# Patient Record
Sex: Female | Born: 1985 | Race: White | Hispanic: No | Marital: Single | Smoking: Former smoker
Health system: Southern US, Community
[De-identification: ages and names within clinical notes are randomized; demographics above are authoritative.]

---
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
## 2007-11-08 ENCOUNTER — Emergency Department: Payer: Self-pay | Admitting: Emergency Medicine

## 2008-07-13 ENCOUNTER — Emergency Department: Payer: Self-pay | Admitting: Unknown Physician Specialty

## 2009-05-28 ENCOUNTER — Emergency Department: Payer: Self-pay | Admitting: Emergency Medicine

## 2010-01-15 ENCOUNTER — Emergency Department: Payer: Self-pay | Admitting: Emergency Medicine

## 2011-12-15 IMAGING — CR DG KNEE COMPLETE 4+V*R*
1 series · 4 of 4 positions shown · non-contrast
Comparison: none

REASON FOR EXAM: R knee pain, swelling
COMMENTS:   LMP: Four weeks ago

PROCEDURE:     DXR - DXR KNEE RT COMP WITH OBLIQUES  - May 28, 2009  [DATE]
RESULT:     Four views of the right knee reveal the bones to be adequately
mineralized. I do not see evidence of an acute fracture nor dislocation.
There is no definite joint effusion.

[Series 1: view not recorded · 0.17mm/px · 4 of 4 slices shown]
[im 1/4]
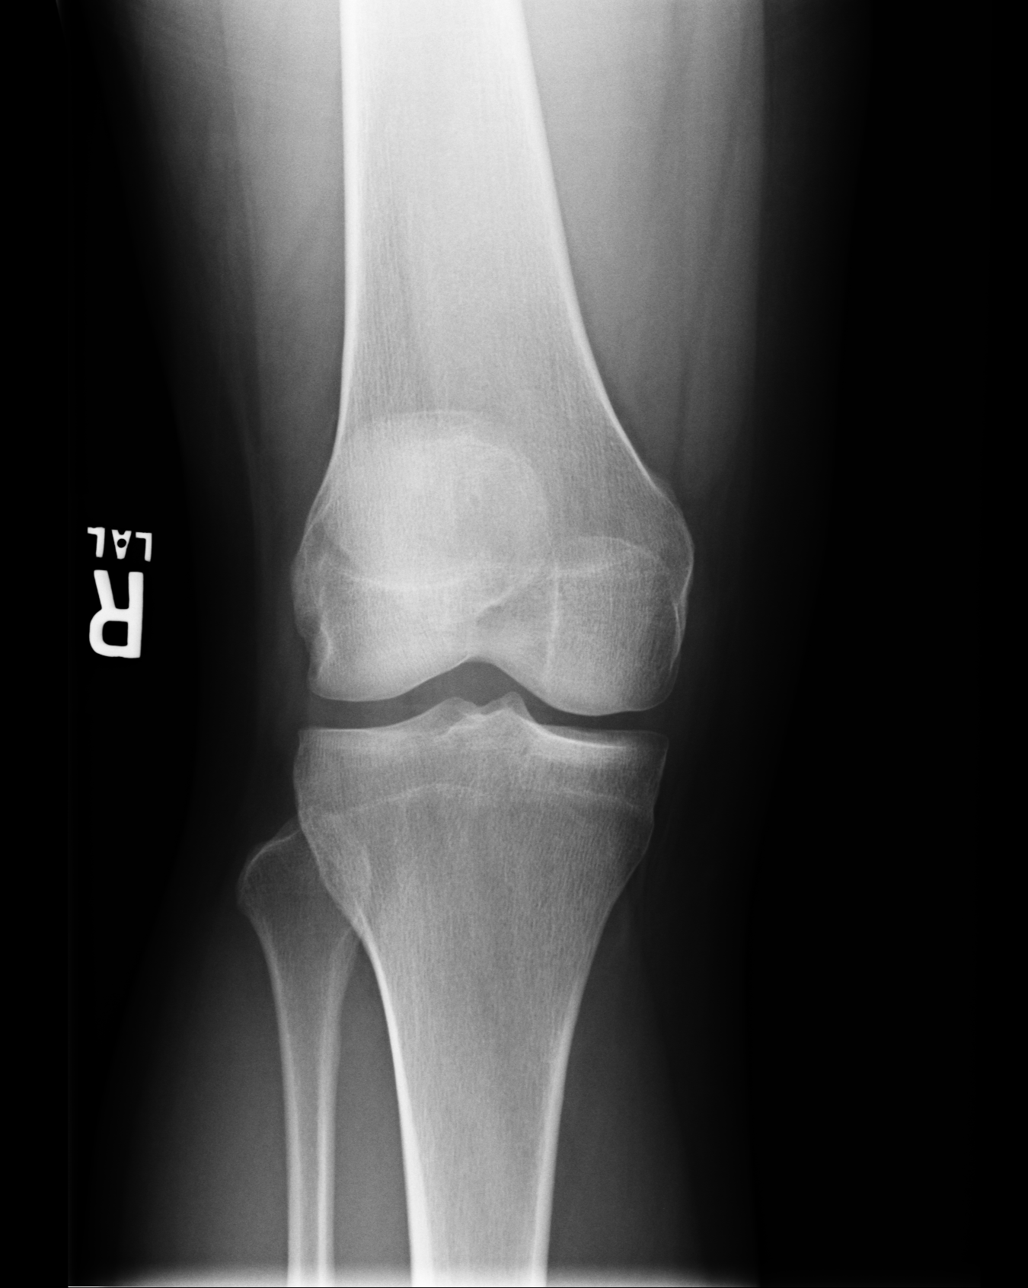
[im 2/4]
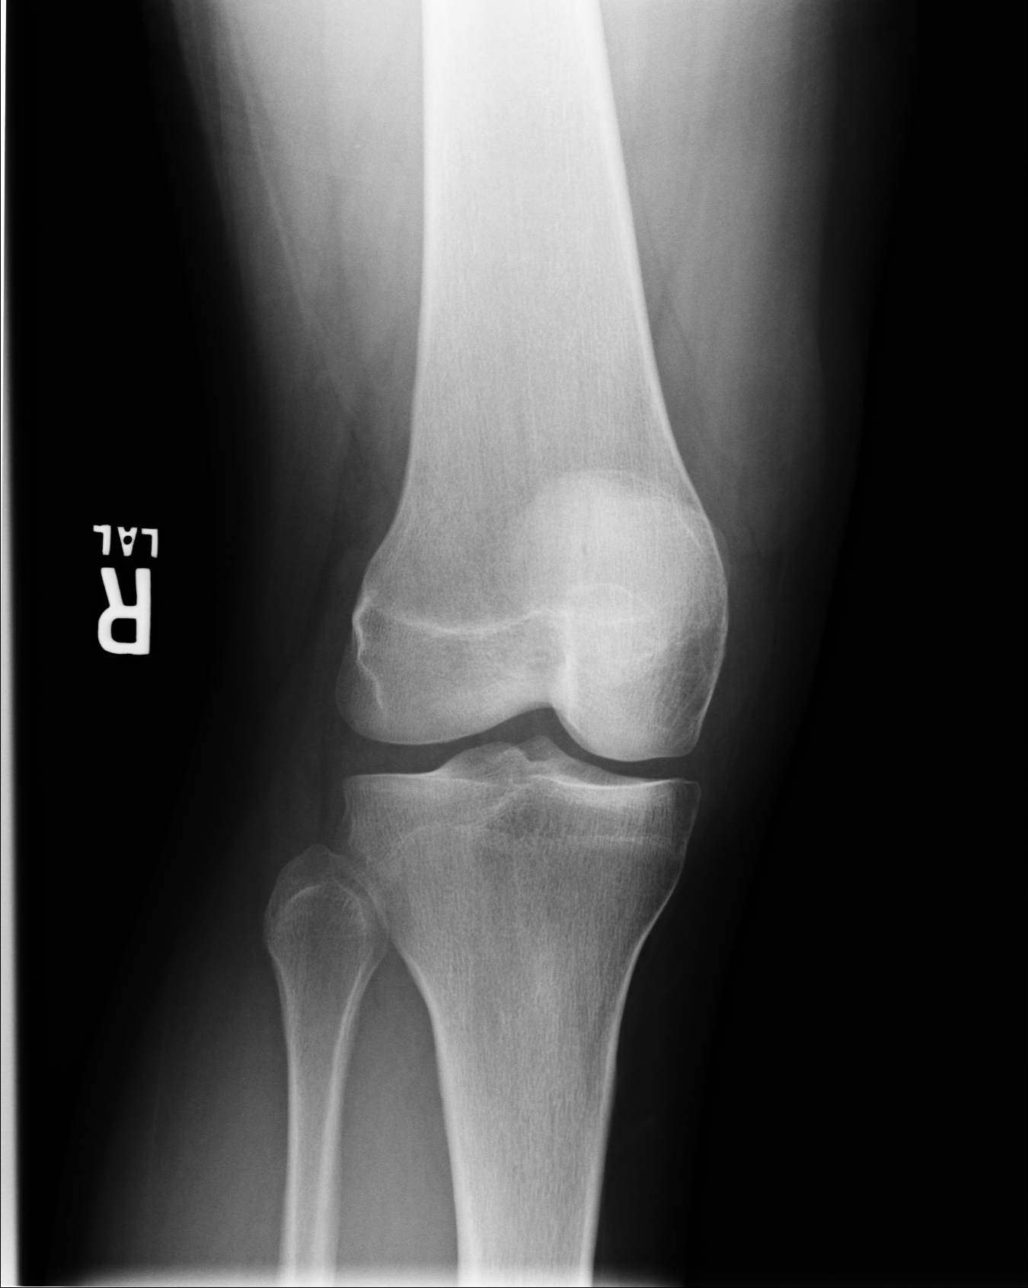
[im 3/4]
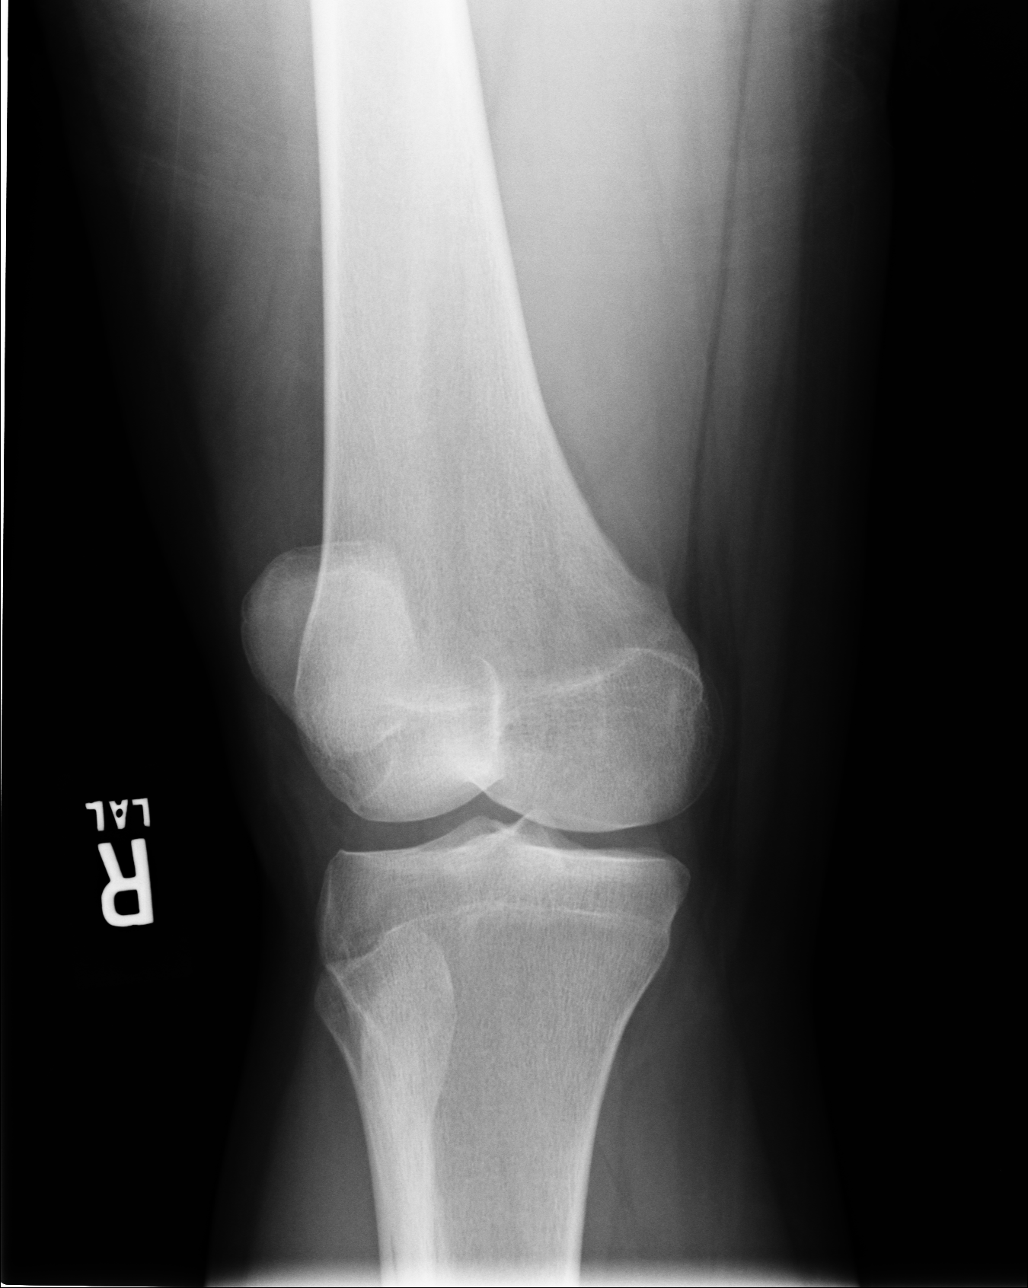
[im 4/4]
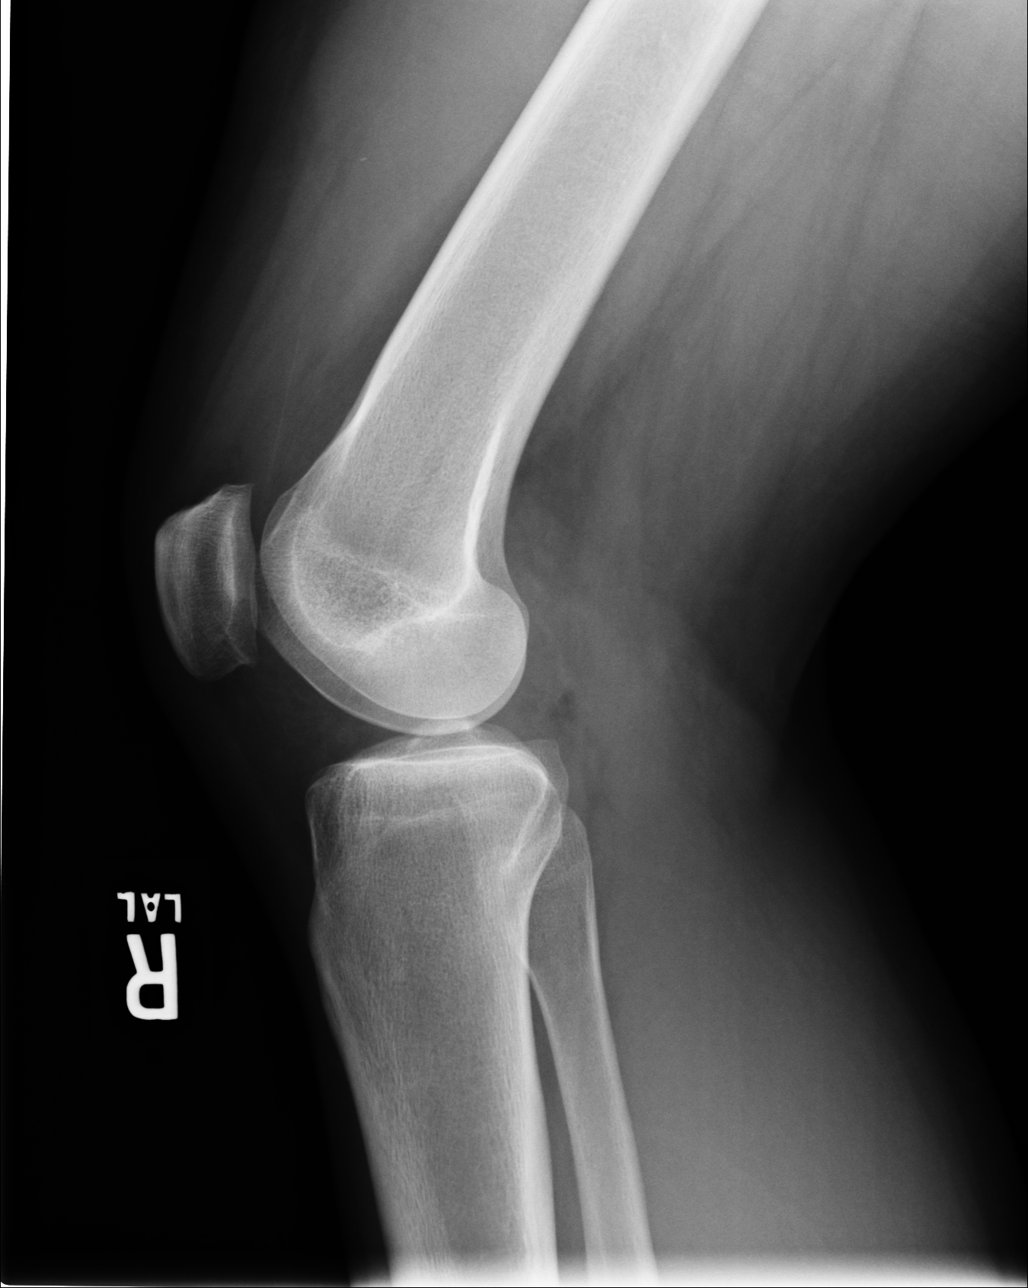

[4 of 4 positions shown; findings below may reference images not displayed]

IMPRESSION: I see no acute bony abnormality of the right foot. Followup
MRI may be useful if there are clinical concerns of internal derangement.

## 2012-04-29 ENCOUNTER — Emergency Department: Payer: Self-pay | Admitting: Emergency Medicine

## 2016-02-12 LAB — LAB REPORT - SCANNED: EGFR: 60

## 2018-08-27 ENCOUNTER — Ambulatory Visit (HOSPITAL_COMMUNITY)
Admission: EM | Admit: 2018-08-27 | Discharge: 2018-08-27 | Discharge status: Absent without leave | Payer: Self-pay | Attending: Family Medicine | Admitting: Family Medicine

## 2022-05-13 ENCOUNTER — Encounter: Payer: Self-pay | Admitting: Family Medicine

## 2022-05-13 ENCOUNTER — Ambulatory Visit (INDEPENDENT_AMBULATORY_CARE_PROVIDER_SITE_OTHER): Payer: 59 | Admitting: Family Medicine

## 2022-05-13 VITALS — BP 142/68 | HR 98 | Temp 98.6°F | Ht 67.0 in | Wt 201.0 lb

## 2022-05-13 DIAGNOSIS — Z7689 Persons encountering health services in other specified circumstances: Secondary | ICD-10-CM | POA: Insufficient documentation

## 2022-05-13 DIAGNOSIS — R030 Elevated blood-pressure reading, without diagnosis of hypertension: Secondary | ICD-10-CM | POA: Diagnosis not present

## 2022-05-13 DIAGNOSIS — H9201 Otalgia, right ear: Secondary | ICD-10-CM

## 2022-05-13 NOTE — Assessment & Plan Note (Signed)
No history of elevated BP. 142/68 today in office and she does endorse feeling anxious about her visit today. Encouraged to check BP outside of office. Will return in 1 week for physical and recheck. Denies chest pain, palpitations, shortness of breath, recurrent headaches, vision changes.

## 2022-05-13 NOTE — Assessment & Plan Note (Signed)
Today we reviewed your medical history and current concerns. Return to office for fasting labs and complete physical with PAP.

## 2022-05-13 NOTE — Assessment & Plan Note (Signed)
No findings suggestive of OM on exam. Encouraged watchful waiting and Tylenol PRN and returnt to office if symptoms persist or worsen.

## 2022-05-13 NOTE — Progress Notes (Signed)
New Patient Office Visit  Subjective    Patient ID: Diane Hall, female    DOB: 13-Sep-1985  Age: 37 y.o. MRN: [id number]  CC:  Chief Complaint  Patient presents with   Establish Care    Ear infection    HPI Diane Hall presents to establish care. Oriented to practice routines and expectations. PMH includes none. Concerns today include right ear pain would like to have a mole on her back removed.  She endorses right ear pain sometimes when she eats that is overall improving Denies fever, chills, body aches, drainage Has had a recent cold and history of recurrent ear infections.   Outpatient Encounter Medications as of 05/13/2022  Medication Sig   phentermine (ADIPEX-P) 37.5 MG tablet Take 37.5 mg by mouth daily before breakfast.   [DISCONTINUED] phentermine 30 MG capsule Take 30 mg by mouth every morning. (Patient not taking: Reported on 05/13/2022)   No facility-administered encounter medications on file as of 05/13/2022.    History reviewed. No pertinent past medical history.  History reviewed. No pertinent surgical history.  Family History  Problem Relation Age of Onset   Anxiety disorder Mother    Hyperlipidemia Mother    Hypertension Mother    Diabetes Mother    Hypertension Father    Cancer Maternal Grandmother    Anxiety disorder Maternal Grandmother    Cancer Maternal Grandfather    Diabetes Maternal Grandfather    COPD Paternal Grandmother    Cancer Paternal Grandmother    Cancer Paternal Grandfather     Social History   Socioeconomic History   Marital status: Single    Spouse name: Not on file   Number of children: Not on file   Years of education: Not on file   Highest education level: Not on file  Occupational History   Not on file  Tobacco Use   Smoking status: Former    Packs/day: 0.50    Years: 10.00    Additional pack years: 0.00    Total pack years: 5.00    Types: Cigarettes    Quit date: 05/2021    Years since quitting: 1.0    Smokeless tobacco: Never  Vaping Use   Vaping Use: Every day  Substance and Sexual Activity   Alcohol use: Never   Drug use: Never   Sexual activity: Not Currently  Other Topics Concern   Not on file  Social History Narrative   Not on file   Social Determinants of Health   Financial Resource Strain: Not on file  Food Insecurity: Not on file  Transportation Needs: Not on file  Physical Activity: Not on file  Stress: Not on file  Social Connections: Not on file  Intimate Partner Violence: Not on file    Review of Systems  All other systems reviewed and are negative.       Objective    BP (!) 142/68   Pulse 98   Temp 98.6 F (37 C) (Oral)   Ht 5\' 7"  (1.702 m)   Wt 201 lb (91.2 kg)   LMP 04/29/2022 (Approximate)   SpO2 98%   BMI 31.48 kg/m   Physical Exam Vitals and nursing note reviewed.  Constitutional:      Appearance: Normal appearance. She is normal weight.  HENT:     Head: Normocephalic and atraumatic.     Right Ear: Tympanic membrane, ear canal and external ear normal.     Left Ear: Tympanic membrane, ear canal and external ear normal.  Cardiovascular:  Rate and Rhythm: Normal rate and regular rhythm.     Pulses: Normal pulses.     Heart sounds: Normal heart sounds.  Pulmonary:     Effort: Pulmonary effort is normal.     Breath sounds: Normal breath sounds.  Skin:    General: Skin is warm and dry.  Neurological:     General: No focal deficit present.     Mental Status: She is alert and oriented to person, place, and time. Mental status is at baseline.  Psychiatric:        Mood and Affect: Mood normal.        Behavior: Behavior normal.        Thought Content: Thought content normal.        Judgment: Judgment normal.         Assessment & Plan:   Problem List Items Addressed This Visit     Right ear pain    No findings suggestive of OM on exam. Encouraged watchful waiting and Tylenol PRN and returnt to office if symptoms persist or  worsen.      Elevated blood pressure reading in office without diagnosis of hypertension    No history of elevated BP. 142/68 today in office and she does endorse feeling anxious about her visit today. Encouraged to check BP outside of office. Will return in 1 week for physical and recheck. Denies chest pain, palpitations, shortness of breath, recurrent headaches, vision changes.      Encounter to establish care with new doctor - Primary    Today we reviewed your medical history and current concerns. Return to office for fasting labs and complete physical with PAP.       Return for annual physical.   Park Meo, FNP

## 2022-05-13 NOTE — Patient Instructions (Signed)
It was great to meet you today and I'm excited to have you join the [facility] practice. I hope you had a positive experience today! If you feel so inclined, please feel free to recommend our practice to friends and family. Arlissa Monteverde, FNP-C  

## 2022-05-14 ENCOUNTER — Other Ambulatory Visit: Payer: 59

## 2022-05-14 DIAGNOSIS — Z7689 Persons encountering health services in other specified circumstances: Secondary | ICD-10-CM

## 2022-05-14 DIAGNOSIS — R030 Elevated blood-pressure reading, without diagnosis of hypertension: Secondary | ICD-10-CM

## 2022-05-14 LAB — CBC WITH DIFFERENTIAL/PLATELET
Basophils Relative: 0.5 %
Eosinophils Absolute: 119 cells/uL (ref 15–500)
Hemoglobin: 13.7 g/dL (ref 11.7–15.5)
Lymphs Abs: 2851 cells/uL (ref 850–3900)
MCH: 32.6 pg (ref 27.0–33.0)
MCHC: 33.3 g/dL (ref 32.0–36.0)
MCV: 98.1 fL (ref 80.0–100.0)
MPV: 10.1 fL (ref 7.5–12.5)
Neutro Abs: 6178 cells/uL (ref 1500–7800)
Neutrophils Relative %: 62.4 %
Platelets: 411 10*3/uL — ABNORMAL HIGH (ref 140–400)
RBC: 4.2 10*6/uL (ref 3.80–5.10)
Total Lymphocyte: 28.8 %
WBC: 9.9 10*3/uL (ref 3.8–10.8)

## 2022-05-15 LAB — COMPLETE METABOLIC PANEL WITH GFR
AG Ratio: 1.5 (calc) (ref 1.0–2.5)
ALT: 12 U/L (ref 6–29)
AST: 11 U/L (ref 10–30)
Albumin: 4 g/dL (ref 3.6–5.1)
Alkaline phosphatase (APISO): 39 U/L (ref 31–125)
BUN: 10 mg/dL (ref 7–25)
CO2: 24 mmol/L (ref 20–32)
Calcium: 8.9 mg/dL (ref 8.6–10.2)
Chloride: 104 mmol/L (ref 98–110)
Creat: 0.63 mg/dL (ref 0.50–0.97)
Globulin: 2.7 g/dL (calc) (ref 1.9–3.7)
Glucose, Bld: 95 mg/dL (ref 65–99)
Potassium: 4.2 mmol/L (ref 3.5–5.3)
Sodium: 139 mmol/L (ref 135–146)
Total Bilirubin: 0.2 mg/dL (ref 0.2–1.2)
Total Protein: 6.7 g/dL (ref 6.1–8.1)
eGFR: 119 mL/min/{1.73_m2} (ref >=60)

## 2022-05-15 LAB — CBC WITH DIFFERENTIAL/PLATELET
Absolute Monocytes: 703 cells/uL (ref 200–950)
Basophils Absolute: 50 cells/uL (ref 0–200)
Eosinophils Relative: 1.2 %
HCT: 41.2 % (ref 35.0–45.0)
Monocytes Relative: 7.1 %
RDW: 11.9 % (ref 11.0–15.0)

## 2022-05-15 LAB — LIPID PANEL
Cholesterol: 169 mg/dL (ref <200)
HDL: 41 mg/dL — ABNORMAL LOW (ref >=50)
LDL Cholesterol (Calc): 91 mg/dL (calc)
Non-HDL Cholesterol (Calc): 128 mg/dL (calc) (ref <130)
Total CHOL/HDL Ratio: 4.1 (calc) (ref <5.0)
Triglycerides: 282 mg/dL — ABNORMAL HIGH (ref <150)

## 2022-05-27 ENCOUNTER — Encounter: Payer: 59 | Admitting: Family Medicine

## 2022-06-03 ENCOUNTER — Ambulatory Visit (INDEPENDENT_AMBULATORY_CARE_PROVIDER_SITE_OTHER): Payer: 59 | Admitting: Family Medicine

## 2022-06-03 ENCOUNTER — Encounter: Payer: Self-pay | Admitting: Family Medicine

## 2022-06-03 VITALS — BP 130/72 | HR 87 | Temp 98.5°F | Ht 67.0 in | Wt 201.0 lb

## 2022-06-03 DIAGNOSIS — Z0001 Encounter for general adult medical examination with abnormal findings: Secondary | ICD-10-CM | POA: Diagnosis not present

## 2022-06-03 DIAGNOSIS — N898 Other specified noninflammatory disorders of vagina: Secondary | ICD-10-CM | POA: Diagnosis not present

## 2022-06-03 DIAGNOSIS — Z23 Encounter for immunization: Secondary | ICD-10-CM | POA: Diagnosis not present

## 2022-06-03 DIAGNOSIS — A599 Trichomoniasis, unspecified: Secondary | ICD-10-CM | POA: Diagnosis not present

## 2022-06-03 DIAGNOSIS — Z124 Encounter for screening for malignant neoplasm of cervix: Secondary | ICD-10-CM

## 2022-06-03 DIAGNOSIS — Z113 Encounter for screening for infections with a predominantly sexual mode of transmission: Secondary | ICD-10-CM | POA: Diagnosis not present

## 2022-06-03 DIAGNOSIS — Z0000 Encounter for general adult medical examination without abnormal findings: Secondary | ICD-10-CM

## 2022-06-03 NOTE — Assessment & Plan Note (Signed)

## 2022-06-03 NOTE — Progress Notes (Signed)
   Subjective:   Diane Hall is a 37 y.o. female for annual routine Pap and checkup. Current Outpatient Medications  Medication Sig Dispense Refill   phentermine (ADIPEX-P) 37.5 MG tablet Take 37.5 mg by mouth daily before breakfast.     No current facility-administered medications for this visit.   Allergies: Patient has no allergy information on record.  Patient's last menstrual period was 05/27/2022 (approximate).  ROS:  Feeling well. No dyspnea or chest pain on exertion.  No abdominal pain, change in bowel habits, black or bloody stools.  No urinary tract symptoms. GYN ROS: normal menses, no abnormal bleeding, pelvic pain or discharge, no breast pain or new or enlarging lumps on self exam. No neurological complaints.  No past medical history on file. No past surgical history on file.   Objective:   The patient appears well, alert, oriented x 3, in no distress. BP 130/72   Pulse 87   Temp 98.5 F (36.9 C) (Oral)   Ht 5\' 7"  (1.702 m)   Wt 201 lb (91.2 kg)   LMP 05/27/2022 (Approximate)   SpO2 99%   BMI 31.48 kg/m  ENT normal.  Neck supple. No adenopathy or thyromegaly. PERLA. Lungs are clear, good air entry, no wheezes, rhonchi or rales. S1 and S2 normal, no murmurs, regular rate and rhythm. Abdomen soft without tenderness, guarding, mass or organomegaly. Extremities show no edema, normal peripheral pulses. Neurological is normal, no focal findings.  BREAST EXAM: breasts appear normal, no suspicious masses, no skin or nipple changes or axillary nodes  PELVIC EXAM: normal external genitalia, vulva, vagina, cervix, uterus and adnexa  Assessment & Plan:   well woman  PLAN:  pap smear return annually or prn    Physical exam, annual Assessment & Plan: Today your medical history was reviewed and routine physical exam with labs was performed. Recommend 150 minutes of moderate intensity exercise weekly and consuming a well-balanced diet. Advised to stop smoking if a  smoker, avoid smoking if a non-smoker, limit alcohol consumption to 1 drink per day for women and 2 drinks per day for men, and avoid illicit drug use. Counseled on safe sex practices and offered STI testing today. Counseled on the importance of sunscreen use. Counseled in mental health awareness and when to seek medical care. Vaccine maintenance discussed. Appropriate health maintenance items reviewed. Return to office in 1 year for annual physical exam.    Cervical cancer screening Assessment & Plan: PAP performed without abnormal findings. Follow-up as appropriate based on cytology results. STI testing included as requested.  Orders: -     Pap, TP Imaging w/ CT/GC and w/ HPV RNA, rflx HPV Type 16/18  Routine screening for STI (sexually transmitted infection) -     SureSwab Advanced Vaginitis, TMA -     Hepatitis Panel (REFL) -     HIV Antibody (routine testing w rflx) -     HSV(herpes simplex vrs) 1+2 ab-IgG -     RPR  Need for vaccination -     Tdap vaccine greater than or equal to 7yo IM     Follow up plan: Return in about 1 year (around 06/03/2023) for annual physical, labs 1 week prior.  Park Meo, FNP

## 2022-06-03 NOTE — Assessment & Plan Note (Signed)
PAP performed without abnormal findings. Follow-up as appropriate based on cytology results. STI testing included as requested.

## 2022-06-04 ENCOUNTER — Other Ambulatory Visit: Payer: Self-pay

## 2022-06-04 DIAGNOSIS — Z23 Encounter for immunization: Secondary | ICD-10-CM

## 2022-06-04 LAB — HEPATITIS PANEL(REFL)
HEPATITIS C ANTIBODY REFILL$(REFL): NONREACTIVE
Hep B S Ab: REACTIVE — AB
Hepatitis B Surface Ag: NONREACTIVE

## 2022-06-04 LAB — SURESWAB® ADVANCED VAGINITIS,TMA
CANDIDA SPECIES: NOT DETECTED
Candida glabrata: NOT DETECTED
SURESWAB(R) ADV BACTERIAL VAGINOSIS(BV),TMA: POSITIVE — AB
TRICHOMONAS VAGINALIS (TV),TMA: DETECTED — AB

## 2022-06-05 LAB — HEPATITIS PANEL(REFL)
Hep B Core Total Ab: NONREACTIVE
Hepatitis A AB,Total: NONREACTIVE

## 2022-06-05 LAB — REFLEX TIQ

## 2022-06-05 LAB — RPR: RPR Ser Ql: NONREACTIVE

## 2022-06-05 LAB — HSV(HERPES SIMPLEX VRS) I + II AB-IGG
HAV 1 IGG,TYPE SPECIFIC AB: <0.9 index
HSV 2 IGG,TYPE SPECIFIC AB: <0.9 index

## 2022-06-05 LAB — HIV ANTIBODY (ROUTINE TESTING W REFLEX): HIV 1&2 Ab, 4th Generation: NONREACTIVE

## 2022-06-05 MED ORDER — METRONIDAZOLE 500 MG PO TABS: 500.0000 mg | ORAL_TABLET | Freq: Two times a day (BID) | ORAL | 0 refills | Status: AC

## 2022-06-05 NOTE — Addendum Note (Signed)
Addended by: Park Meo on: 06/05/2022 03:24 PM   Modules accepted: Orders

## 2022-06-09 LAB — PAP, TP IMAGING W/ HPV RNA, RFLX HPV TYPE 16,18/45: HPV DNA High Risk: NOT DETECTED

## 2022-06-09 LAB — C. TRACHOMATIS/N. GONORRHOEAE RNA
C. trachomatis RNA, TMA: NOT DETECTED
N. gonorrhoeae RNA, TMA: NOT DETECTED

## 2022-06-09 LAB — PAP, TP IMAGING W/ CT/GC AND W/ HPV RNA, RFLX HPV TYPE 16/18

## 2022-06-24 ENCOUNTER — Ambulatory Visit (INDEPENDENT_AMBULATORY_CARE_PROVIDER_SITE_OTHER): Payer: 59 | Admitting: Family Medicine

## 2022-06-24 ENCOUNTER — Encounter: Payer: Self-pay | Admitting: Family Medicine

## 2022-06-24 VITALS — BP 140/72 | HR 87 | Temp 98.7°F | Ht 67.0 in | Wt 199.6 lb

## 2022-06-24 DIAGNOSIS — D239 Other benign neoplasm of skin, unspecified: Secondary | ICD-10-CM

## 2022-06-24 NOTE — Progress Notes (Signed)
Subjective:    Patient ID: Diane Hall, female    DOB: Mar 13, 1985, 37 y.o.   MRN: [id number]  HPI Patient is a very pleasant 37 year old Caucasian female who presents today requesting excision of the lesion on her back.  On the upper middle left side of her back just below her scapula is an enlarging papilloma.  It is roughly 1 cm in diameter.  It is pink in color and freely mobile and soft touch.  It bothers her because it is growing and it also gets aggravated by her clothing.  Therefore she would like this removed.  It does not appear to be cancerous No past medical history on file. No past surgical history on file. Current Outpatient Medications on File Prior to Visit  Medication Sig Dispense Refill  . phentermine (ADIPEX-P) 37.5 MG tablet Take 37.5 mg by mouth daily before breakfast.     No current facility-administered medications on file prior to visit.   NKDA Social History   Socioeconomic History  . Marital status: Single    Spouse name: Not on file  . Number of children: Not on file  . Years of education: Not on file  . Highest education level: Not on file  Occupational History  . Not on file  Tobacco Use  . Smoking status: Former    Packs/day: 0.50    Years: 10.00    Additional pack years: 0.00    Total pack years: 5.00    Types: Cigarettes    Quit date: 05/2021    Years since quitting: 1.1  . Smokeless tobacco: Never  Vaping Use  . Vaping Use: Every day  Substance and Sexual Activity  . Alcohol use: Never  . Drug use: Never  . Sexual activity: Not Currently  Other Topics Concern  . Not on file  Social History Narrative  . Not on file   Social Determinants of Health   Financial Resource Strain: Not on file  Food Insecurity: Not on file  Transportation Needs: Not on file  Physical Activity: Not on file  Stress: Not on file  Social Connections: Not on file  Intimate Partner Violence: Not on file      Review of Systems  All other systems  reviewed and are negative.      Objective:   Physical Exam Vitals reviewed.  Constitutional:      General: She is not in acute distress.    Appearance: Normal appearance. She is obese. She is not ill-appearing or toxic-appearing.  Cardiovascular:     Rate and Rhythm: Normal rate and regular rhythm.     Heart sounds: Normal heart sounds.  Pulmonary:     Effort: Pulmonary effort is normal. No respiratory distress.     Breath sounds: Normal breath sounds. No wheezing, rhonchi or rales.    Neurological:     Mental Status: She is alert.         Assessment & Plan:  Benign skin papilloma - Plan: CANCELED: Pathology Report ([facility]) Lesion was anesthetized with 0.1% lidocaine with epinephrine.  The patient was then prepped and draped in sterile fashion.  An elliptical excision was performed of the entire papilloma down to the underlying dermis.  The excision was approximately 1 cm x 1 cm.  Skin edges were then approximated using 2 simple interrupted 3-0 Ethilon sutures.  There was minimal blood loss.  The wound was then covered with Neosporin and a Band-Aid.  No reason for pathology as the lesion is clearly benign.  Recommended stitches come out in 7 days

## 2022-07-11 ENCOUNTER — Other Ambulatory Visit (INDEPENDENT_AMBULATORY_CARE_PROVIDER_SITE_OTHER): Payer: 59

## 2022-07-11 DIAGNOSIS — Z23 Encounter for immunization: Secondary | ICD-10-CM

## 2022-08-20 ENCOUNTER — Ambulatory Visit (INDEPENDENT_AMBULATORY_CARE_PROVIDER_SITE_OTHER): Payer: 59 | Admitting: Family Medicine

## 2022-08-20 ENCOUNTER — Encounter: Payer: Self-pay | Admitting: Family Medicine

## 2022-08-20 VITALS — BP 150/80 | HR 93 | Temp 97.8°F | Ht 67.0 in | Wt 194.0 lb

## 2022-08-20 DIAGNOSIS — L720 Epidermal cyst: Secondary | ICD-10-CM

## 2022-08-20 NOTE — Assessment & Plan Note (Signed)
Lesion is not actively draining, no signs of infection or inflammation, this is a 0.5cm round lesion that is light pink in color. I encouraged her to use warm compresses and return to my office if it continues to grow or becomes reddened or inflamed or has purulent drainage. Continue to monitor.

## 2022-08-20 NOTE — Progress Notes (Signed)
   Subjective:  HPI: Diane Hall is a 37 y.o. female presenting on 08/20/2022 for Follow-up (Bump on face for roughly a month and it keeps getting bigger)   HPI Patient is in today for a painful bump on her left cheek for 1 month. It is red and does not itch. It is growing in size. No drainage. It is not bothering her but she is worried about appearance as she is in a wedding soon.  Review of Systems  All other systems reviewed and are negative.   Relevant past medical history reviewed and updated as indicated.   No past medical history on file.   No past surgical history on file.  Allergies and medications reviewed and updated.   Current Outpatient Medications:    phentermine (ADIPEX-P) 37.5 MG tablet, Take 37.5 mg by mouth daily before breakfast., Disp: , Rfl:   Not on File  Objective:   BP (!) 150/80   Pulse 93   Temp 97.8 F (36.6 C) (Oral)   Ht 5\' 7"  (1.702 m)   Wt 194 lb (88 kg)   LMP 07/22/2022 (Approximate)   SpO2 98%   BMI 30.38 kg/m      08/20/2022    3:55 PM 08/20/2022    3:49 PM 06/24/2022    3:43 PM  Vitals with BMI  Height  5\' 7"  5\' 7"   Weight  194 lbs 199 lbs 10 oz  BMI  30.38 31.25  Systolic 150 150 161  Diastolic 80 82 72  Pulse  93 87     Physical Exam Vitals and nursing note reviewed.  Constitutional:      Appearance: Normal appearance. She is normal weight.  HENT:     Head: Normocephalic and atraumatic.  Skin:    General: Skin is warm and dry.  Neurological:     General: No focal deficit present.     Mental Status: She is alert and oriented to person, place, and time. Mental status is at baseline.  Psychiatric:        Mood and Affect: Mood normal.        Behavior: Behavior normal.        Thought Content: Thought content normal.        Judgment: Judgment normal.     Assessment & Plan:  Epidermoid cyst of face Assessment & Plan: Lesion is not actively draining, no signs of infection or inflammation, this is a 0.5cm round  lesion that is light pink in color. I encouraged her to use warm compresses and return to my office if it continues to grow or becomes reddened or inflamed or has purulent drainage. Continue to monitor.       Follow up plan: Return if symptoms worsen or fail to improve.  Park Meo, FNP
